# Patient Record
Sex: Female | Born: 1965 | Race: White | Hispanic: No | Smoking: Never smoker
Health system: Southern US, Community
[De-identification: ages and names within clinical notes are randomized; demographics above are authoritative.]

## PROBLEM LIST (undated history)

## (undated) DIAGNOSIS — F988 Other specified behavioral and emotional disorders with onset usually occurring in childhood and adolescence: Secondary | ICD-10-CM

## (undated) DIAGNOSIS — R42 Dizziness and giddiness: Secondary | ICD-10-CM

## (undated) HISTORY — PX: TONSILLECTOMY: SUR1361

## (undated) HISTORY — PX: BREAST SURGERY: SHX581

---

## 1998-01-01 ENCOUNTER — Other Ambulatory Visit: Admission: RE | Admit: 1998-01-01 | Discharge: 1998-01-01 | Payer: Self-pay | Admitting: Obstetrics and Gynecology

## 1999-01-05 ENCOUNTER — Other Ambulatory Visit: Admission: RE | Admit: 1999-01-05 | Discharge: 1999-01-05 | Payer: Self-pay | Admitting: Obstetrics and Gynecology

## 2000-01-05 ENCOUNTER — Other Ambulatory Visit: Admission: RE | Admit: 2000-01-05 | Discharge: 2000-01-05 | Payer: Self-pay | Admitting: Obstetrics and Gynecology

## 2001-01-08 ENCOUNTER — Other Ambulatory Visit: Admission: RE | Admit: 2001-01-08 | Discharge: 2001-01-08 | Payer: Self-pay | Admitting: Obstetrics and Gynecology

## 2002-01-16 ENCOUNTER — Encounter: Payer: Self-pay | Admitting: Obstetrics and Gynecology

## 2002-01-16 ENCOUNTER — Ambulatory Visit (HOSPITAL_COMMUNITY): Admission: RE | Admit: 2002-01-16 | Discharge: 2002-01-16 | Payer: Self-pay | Admitting: Obstetrics and Gynecology

## 2002-02-18 ENCOUNTER — Other Ambulatory Visit: Admission: RE | Admit: 2002-02-18 | Discharge: 2002-02-18 | Payer: Self-pay | Admitting: Obstetrics and Gynecology

## 2002-08-16 ENCOUNTER — Ambulatory Visit (HOSPITAL_COMMUNITY): Admission: RE | Admit: 2002-08-16 | Discharge: 2002-08-16 | Payer: Self-pay | Admitting: Neurology

## 2002-08-16 ENCOUNTER — Encounter: Payer: Self-pay | Admitting: Neurology

## 2003-04-01 ENCOUNTER — Other Ambulatory Visit: Admission: RE | Admit: 2003-04-01 | Discharge: 2003-04-01 | Payer: Self-pay | Admitting: Obstetrics and Gynecology

## 2003-05-16 ENCOUNTER — Ambulatory Visit (HOSPITAL_COMMUNITY): Admission: RE | Admit: 2003-05-16 | Discharge: 2003-05-16 | Payer: Self-pay | Admitting: Obstetrics and Gynecology

## 2003-11-03 ENCOUNTER — Ambulatory Visit: Admission: RE | Admit: 2003-11-03 | Discharge: 2003-11-03 | Payer: Self-pay | Admitting: *Deleted

## 2003-11-04 ENCOUNTER — Inpatient Hospital Stay (HOSPITAL_COMMUNITY): Admission: AD | Admit: 2003-11-04 | Discharge: 2003-11-08 | Payer: Self-pay | Admitting: Neurology

## 2004-06-10 ENCOUNTER — Other Ambulatory Visit: Admission: RE | Admit: 2004-06-10 | Discharge: 2004-06-10 | Payer: Self-pay | Admitting: Obstetrics and Gynecology

## 2010-02-03 ENCOUNTER — Ambulatory Visit (HOSPITAL_COMMUNITY): Admission: RE | Admit: 2010-02-03 | Discharge: 2010-02-03 | Payer: Self-pay | Admitting: Orthopedic Surgery

## 2010-07-26 ENCOUNTER — Encounter (INDEPENDENT_AMBULATORY_CARE_PROVIDER_SITE_OTHER): Payer: Self-pay | Admitting: *Deleted

## 2010-09-06 ENCOUNTER — Ambulatory Visit: Payer: Self-pay | Admitting: Internal Medicine

## 2010-09-06 DIAGNOSIS — K5900 Constipation, unspecified: Secondary | ICD-10-CM | POA: Insufficient documentation

## 2010-10-13 ENCOUNTER — Ambulatory Visit: Payer: Self-pay | Admitting: Internal Medicine

## 2010-10-15 ENCOUNTER — Encounter: Payer: Self-pay | Admitting: Internal Medicine

## 2010-11-16 NOTE — Letter (Signed)
Summary: New Patient letter  [facility]  [address] [place], [place] [address]   Phone: [phone]  Fax: [phone]       07/26/2010 MRN: [id number]  Victoria [name] [address] [place], [place]  [address]  Dear Ms. Victoria [name],  Welcome to the Gastroenterology Division at [facility].    You are scheduled to see Dr. Marina Goodell on 09/06/2010 at 11:00AM on the 3rd floor at [facility], [address]  We ask that you try to arrive at our office 15 minutes prior to your appointment time to allow for check-in.  We would like you to complete the enclosed self-administered evaluation form prior to your visit and bring it with you on the day of your appointment.  We will review it with you.  Also, please bring a complete list of all your medications or, if you prefer, bring the medication bottles and we will list them.  Please bring your insurance card so that we may make a copy of it.  If your insurance requires a referral to see a specialist, please bring your referral form from your primary care physician.  Co-payments are due at the time of your visit and may be paid by cash, check or credit card.     Your office visit will consist of a consult with your physician (includes a physical exam), any laboratory testing he/she may order, scheduling of any necessary diagnostic testing (e.g. x-ray, ultrasound, CT-scan), and scheduling of a procedure (e.g. Endoscopy, Colonoscopy) if required.  Please allow enough time on your schedule to allow for any/all of these possibilities.    If you cannot keep your appointment, please call [phone] to cancel or reschedule prior to your appointment date.  This allows [place] the opportunity to schedule an appointment for another patient in need of care.  If you do not cancel or reschedule by 5 p.m. the business day prior to your appointment date, you will be charged a $50.00 late cancellation/no-show fee.    Thank you for choosing [facility] for your medical needs.  We appreciate the opportunity to care for you.  Please visit [place] at our website  to learn more about our practice.                     Sincerely,                                                             The Gastroenterology Division

## 2010-11-16 NOTE — Letter (Signed)
Summary: [facility] Instructions  [facility]  [address] [place], [place] [address]   Phone: [phone]  Fax: [phone]       Victoria [name]    [birth year]    MRN: [id number]        Procedure Day /Date:WEDNESDAY 10/13/10     Arrival Time:1:30 PM     Procedure Time:2:30 PM     Location of Procedure:                    X  [facility] (4th Floor)     PREPARATION FOR COLONOSCOPY WITH MOVIPREP   Starting 5 days prior to your procedure 10/08/10 do not eat nuts, seeds, popcorn, corn, beans, peas,  salads, or any raw vegetables.  Do not take any fiber supplements (e.g. Metamucil, Citrucel, and Benefiber).  THE DAY BEFORE YOUR PROCEDURE         DATE: 10/12/10 DAY: TUESDAY / 1.  Drink clear liquids the entire day-NO SOLID FOOD  2.  Do not drink anything colored red or purple.  Avoid juices with pulp.  No orange juice.  3.  Drink at least 64 oz. (8 glasses) of fluid/clear liquids during the day to prevent dehydration and help the prep work efficiently.  CLEAR LIQUIDS INCLUDE: Water Jello Ice Popsicles Tea (sugar ok, no milk/cream) Powdered fruit flavored drinks Coffee (sugar ok, no milk/cream) Gatorade Juice: apple, white grape, white cranberry  Lemonade Clear bullion, consomm, broth Carbonated beverages (any kind) Strained chicken noodle soup Hard Candy                             4.  In the morning, mix first dose of MoviPrep solution:    Empty 1 Pouch A and 1 Pouch B into the disposable container    Add lukewarm drinking water to the top line of the container. Mix to dissolve    Refrigerate (mixed solution should be used within 24 hrs)  5.  Begin drinking the prep at 5:00 p.m. The MoviPrep container is divided by 4 marks.   Every 15 minutes drink the solution down to the next mark (approximately 8 oz) until the full liter is complete.   6.  Follow completed prep with 16 oz of clear liquid of your choice (Nothing red or purple).   Continue to drink clear liquids until bedtime.  7.  Before going to bed, mix second dose of MoviPrep solution:    Empty 1 Pouch A and 1 Pouch B into the disposable container    Add lukewarm drinking water to the top line of the container. Mix to dissolve    Refrigerate  THE DAY OF YOUR PROCEDURE      DATE: 10/13/10/DAY: WEDNESDAY  Beginning at 9:30 a.m. (5 hours before procedure):         1. Every 15 minutes, drink the solution down to the next mark (approx 8 oz) until the full liter is complete.  2. Follow completed prep with 16 oz. of clear liquid of your choice.    3. You may drink clear liquids until 7:30 AM (2 HOURS BEFORE PROCEDURE).   MEDICATION INSTRUCTIONS  Unless otherwise instructed, you should take regular prescription medications with a small sip of water   as early as possible the morning of your procedure.         OTHER INSTRUCTIONS  You will need a responsible adult at least 46 years of age  to accompany you and drive you home.   This person must remain in the waiting room during your procedure.  Wear loose fitting clothing that is easily removed.  Leave jewelry and other valuables at home.  However, you may wish to bring a book to read or  an iPod/MP3 player to listen to music as you wait for your procedure to start.  Remove all body piercing jewelry and leave at home.  Total time from sign-in until discharge is approximately 2-3 hours.  You should go home directly after your procedure and rest.  You can resume normal activities the  day after your procedure.  The day of your procedure you should not:   Drive   Make legal decisions   Operate machinery   Drink alcohol   Return to work  You will receive specific instructions about eating, activities and medications before you leave.    The above instructions have been reviewed and explained to me by   _______________________    I fully understand and can verbalize these instructions  _____________________________ Date _________

## 2010-11-16 NOTE — Assessment & Plan Note (Signed)
Summary: FAMILY HISTORY COLON CANCER, constipation   History of Present Illness Visit Type: Initial Consult Primary GI MD: Yancey Flemings MD Primary Provider: Candice Camp, MD Requesting Provider: Fawn Kirk, MD Chief Complaint: constipation, family hx of colon cancer History of Present Illness:   45 year old female with a history of depression, and cervical spine disease. She presents today regarding chronic problems with constipation and family history of colon cancer. He reports significant constipation over the past 6 months, at least. No new medications. She takes Correctol, Ducolax, and one scoop MiraLax. These agents were, but cause diarrhea. No bleeding or weight loss. Uncle (maternal) died of colon cancer at age 26. Mother and siblings without polyps.   GI Review of Systems    Reports weight gain.      Denies abdominal pain, acid reflux, belching, bloating, chest pain, dysphagia with liquids, dysphagia with solids, heartburn, loss of appetite, nausea, vomiting, vomiting blood, and  weight loss.      Reports change in bowel habits, constipation, and  diarrhea.     Denies anal fissure, black tarry stools, diverticulosis, fecal incontinence, heme positive stool, hemorrhoids, irritable bowel syndrome, jaundice, light color stool, liver problems, rectal bleeding, and  rectal pain. Preventive Screening-Counseling & Management  Alcohol-Tobacco     Smoking Status: current      Drug Use:  no.      Current Medications (verified): 1)  Symbyax 6-50 Mg Caps (Olanzapine-Fluoxetine Hcl) .... Take 1 Capsule By Mouth Every Evening 2)  Femhrt 1/5 1-5 Mg-Mcg Tabs (Norethindrone-Eth Estradiol) .... Once Daily As Directed 3)  Adderall 30 Mg Tabs (Amphetamine-Dextroamphetamine) .... Take 1 Tablet By Mouth Two Times A Day 4)  Multivitamins  Tabs (Multiple Vitamin) .... Once Daily 5)  Lamictal 100 Mg Tabs (Lamotrigine) .... Take 1 Tablet By Mouth Once Daily  Allergies (verified): 1)  ! 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  Alcohol Use - no Daily Caffeine Use Illicit Drug Use - no Smoking Status:  current Drug Use:  no  Review of Systems       The patient complains of allergy/sinus, arthritis/joint pain, back pain, and depression-new.  The patient denies anemia, anxiety-new, blood in urine, breast changes/lumps, change in vision, confusion, cough, coughing up blood, fainting, fatigue, fever, headaches-new, hearing problems, heart murmur, heart rhythm changes, itching, menstrual pain, muscle pains/cramps, night sweats, nosebleeds, pregnancy symptoms, shortness of breath, skin rash, sleeping problems, sore throat, swelling of feet/legs, swollen lymph glands, thirst - excessive , urination - excessive , urination changes/pain, urine leakage, vision changes, and voice change.    Vital Signs:  Patient profile:   45 year old female Height:      67 inches Weight:      153.50 pounds BMI:     24.13 Pulse rate:   76 / minute Pulse rhythm:   regular BP sitting:   92 / 62  (left arm) Cuff size:   regular  Vitals Entered By: June McMurray CMA Duncan Dull) (September 06, 2010 10:07 AM)  Physical Exam  General:  Well developed, well nourished, no acute distress. Head:  Normocephalic and atraumatic. Eyes:  PERRLA, no icterus. Ears:  Normal auditory acuity. Nose:  No deformity, discharge,  or lesions. Mouth:  No deformity or lesions,  dentition normal. Neck:  scar in the anterior neck. Otherwise normal Lungs:  Clear throughout to auscultation. Heart:  Regular rate and rhythm; no murmurs, rubs,  or bruits. Abdomen:  Soft, nontender and nondistended. No masses,  hepatosplenomegaly or hernias noted. Normal bowel sounds. Rectal:  deferred until colonoscopy Msk:  Symmetrical with no gross deformities. Normal posture. Pulses:  Normal pulses noted. Extremities:  No clubbing, cyanosis, edema or deformities noted. Neurologic:  Alert and  oriented x4;  grossly normal neurologically. Skin:  Intact without significant lesions or rashes.tattoo on the right ankle Psych:  Alert and cooperative. Normal mood and affect.   Impression & Recommendations:  Problem # 1:  FM HX MALIGNANT NEOPLASM GASTROINTESTINAL TRACT (ICD-V16.0) uncle with colon cancer at age 51.  Plan: #1. Screening colonoscopy. The nature of the procedure as well as the risks, benefits, and alternatives have been reviewed. She understood and agreed to proceed. Movi prep prescribed. The patient instructed on its use  Problem # 2:  SCREENING, COLON CANCER (ICD-V76.51) higher than baseline risk. See above  Problem # 3:  CONSTIPATION (ICD-564.00) chronic constipation. Likely functional.  Plan:  #1. Instructed to titrate MiraLax to achieve desired result  Other Orders: Colonoscopy (Colon)  Patient Instructions: 1)  Colonoscopy [facility] 10/13/10 2:30 pm 2)  Movi prep instructions given  3)  Movi prep Rx. sent to pharmacy 4)  Copy sent to : Candice Camp M.D. 5)  Colonoscopy and Flexible Sigmoidoscopy brochure given.  6)  The medication list was reviewed and reconciled.  All changed / newly prescribed medications were explained.  A complete medication list was provided to the patient / caregiver. Prescriptions: MOVIPREP 100 GM  SOLR (PEG-KCL-NACL-NASULF-NA ASC-C) As per prep instructions.  #1 x 0   Entered by:   Milford Cage NCMA   Authorized by:   Hilarie Fredrickson MD   Signed by:   Milford Cage NCMA on 09/06/2010   Method used:   Electronically to        [facility]. [phone]* (retail)       [address]       [place], [place]  [address]       Ph:  [phone] or [phone]       Fax: [phone]   RxID:   [id number]

## 2010-11-18 NOTE — Letter (Signed)
Summary: Patient Notice- Polyp Results  [facility]  [address] [place], [place] [address]   Phone: [phone]  Fax: [phone]        October 15, 2010 MRN: [id number]    TANISHI NAULT [address] [place], [place]  [address]    Dear Ms. Furlough,  I am pleased to inform you that the colon polyp removed during your recent colonoscopy was found to be benign (no cancer detected) upon pathologic examination. It was the hyperplastic (non-precancerous) type.  I recommend you have a repeat colonoscopy examination in 10 years to look for recurrent polyps, as having colon polyps increases your risk for having recurrent polyps or even colon cancer in the future.  Should you develop new or worsening symptoms of abdominal pain, bowel habit changes or bleeding from the rectum or bowels, please schedule an evaluation with either your primary care physician or with me.    Additional information/recommendations:  __ No further action with gastroenterology is needed at this time. Please      follow-up with your primary care physician for your other healthcare      needs.    Please call us if you are having persistent problems or have questions about your condition that have not been fully answered at this time.   Sincerely,  Wilhemina Bonito Perry,Jr. MD [facility] Division of Gastroenterology  This letter has been electronically signed by your physician.  Appended Document: Patient Notice- Polyp Results Letter mailed

## 2010-11-18 NOTE — Procedures (Signed)
Summary: Colonoscopy  [name] Note: All result statuses are Final unless otherwise noted.  Tests: (1) Colonoscopy (COL)   COL Colonoscopy           DONE     [facility]     [address]     [place], [place]  [address]           COLONOSCOPY PROCEDURE REPORT           PATIENT:  Victoria, [name]  MR#:  [id number]     BIRTHDATE:  [birth year], 44 yrs. old  GENDER:  female     ENDOSCOPIST:  Wilhemina Bonito. Eda Keys, MD     REF. BY:  Office     PROCEDURE DATE:  10/13/2010     PROCEDURE:  Colonoscopy with snare polypectomy x 1     ASA CLASS:  Class I     INDICATIONS:  family history of colon cancer, screening,     constipation ; Maternal uncle died of CRC at 70     MEDICATIONS:   Fentanyl 100 mcg IV, Versed 13 mg IV, Benadryl 50     mg IV           DESCRIPTION OF PROCEDURE:   After the risks benefits and     alternatives of the procedure were thoroughly explained, informed     consent was obtained.  Digital rectal exam was performed and     revealed no abnormalities.   The LB 180AL [id number] endoscope was     introduced through the anus and advanced to the cecum, which was     identified by both the appendix and ileocecal valve, without     limitations.Time to cecum = 3:30 min.  The quality of the prep     (Moviprep completed followed by preprocedure enemas) was fair but     up-graded to good with vigorous irrigation and suctioning.  The     instrument was then slowly withdrawn (16:55 min) as the colon was     fully examined.     <<PROCEDUREIMAGES>>           FINDINGS:  A diminutive polyp was found in the sigmoid colon.     Polyp was snared without cautery. Retrieval was successful.     Otherwise normal colonoscopy without other polyps, masses, vascular     ectasias, or inflammatory changes.   Retroflexed views in the     rectum revealed no abnormalities.    The scope was then withdrawn     from the patient and the procedure completed.           COMPLICATIONS:  None  ENDOSCOPIC IMPRESSION:     1) Diminutive polyp in the sigmoid colon - removed     2) Otherwise normal colonoscopy     3) Constipation           RECOMMENDATIONS:     1) Repeat colonoscopy in 5 years if polyp adenomatous; otherwise     10 years           ______________________________     Wilhemina Bonito. Eda Keys, MD           CC:  Victoria Camp, MD; The Patient           n.     eSIGNED:   Wilhemina Bonito. Eda Keys at 10/13/2010 03:23 PM           Victoria, [name], [id number]  Note: An exclamation mark (!) indicates  a result that was not dispersed into the flowsheet. Document Creation Date: 10/13/2010 3:23 PM _______________________________________________________________________  (1) Order result status: Final Collection or observation date-time: 10/13/2010 15:15 Requested date-time:  Receipt date-time:  Reported date-time:  Referring Physician:   Ordering Physician: Fransico Setters [phone]) Specimen Source:  Source: Launa Grill Order Number: [id number] Lab site:   Appended Document: Colonoscopy     Procedures Next Due Date:    Colonoscopy: 10/2020

## 2011-03-04 NOTE — H&P (Signed)
NAME:  Victoria [name], Victoria [name]                             ACCOUNT NO.:  [id number]   MEDICAL RECORD NO.:  [id number]                   PATIENT TYPE:   LOCATION:                                       FACILITY:   PHYSICIAN:  Dineen Kid. Rana Snare, M.D.                 DATE OF BIRTH:  23-Jun-1966   DATE OF ADMISSION:  DATE OF DISCHARGE:                                HISTORY & PHYSICAL   HISTORY OF PRESENT ILLNESS:  Victoria [name] is a 45 year old nulligravida white  female with strong family history of endometriosis and worsening discomfort  right before her cycles.  She also has dysmenorrhea.  She is currently  trying to conceive so does not desire to use oral contraceptive agents.  She  presents for a laparoscopic evaluation of the pain and evaluation for  possible endometriosis.   PAST MEDICAL HISTORY:  Her past medical history is negative.   PAST SURGICAL HISTORY:  Significant only for a tonsillectomy.   MEDICATIONS:  Currently she is on multivitamins.   ALLERGIES:  She is ALLERGIC TO SULFA.   PHYSICAL EXAMINATION:  VITAL SIGNS:  Blood pressure is 98/60, weight 125.  HEART:  Regular rate and rhythm.  LUNGS:  Clear to auscultation bilaterally.  ABDOMEN:  Nondistended, nontender, with a question of a right muscle injury  to the right of the midline along the right rectus muscle, which is mildly  tender to deep palpation.  PELVIC:  Normal external genitalia, Bartholins, Skene, and urethra.  The  uterus is retroverted, mobile, minimally tender to deep palpation.  No  adnexal masses are palpable.  No uterosacral nodularity is noted.   IMPRESSION AND PLAN:  Dysmenorrhea and pelvic pain not responsive to  conservative medical management.  She takes anti-inflammatory medications as  needed.  She does not appear to be a candidate for oral contraceptive agents  at this time, and has a strong family history of endometriosis.  We plan  laparoscopy for evaluation of the pelvic pain, possible lysis of  adhesions  or ablation of endometriosis implants.  Also plan chromopertubation at the  time of the surgery.  The risks and benefits were discussed at length, which  include, but are not limited to:  risk of infection, bleeding, damage to  uterus, tubes and ovaries, bowel, bladder, risks associated with anesthesia,  and also blood transfusion, possibility that we might not be able to  alleviate the pelvic pain or recurrence of the pelvic pain.  She then gave  her informed consent.                                               Dineen Kid Rana Snare, M.D.    DCL/MEDQ  D:  05/15/2003  T:  05/15/2003  Job:  [id number]

## 2011-03-04 NOTE — Discharge Summary (Signed)
NAME:  Victoria [name], Victoria [name]                           ACCOUNT NO.:  [id number]   MEDICAL RECORD NO.:  [id number]                   PATIENT TYPE:  INP   LOCATION:  3038                                 FACILITY:  [facility]   PHYSICIAN:  Genene Churn. Love, M.D.                 DATE OF BIRTH:  1966/07/18   DATE OF ADMISSION:  11/04/2003  DATE OF DISCHARGE:  11/08/2003                                 DISCHARGE SUMMARY   CHIEF COMPLAINT:  This is the first [facility] admission for this  45 year old right-handed white married female from [place], [place], admitted  from the office for evaluation of meningismus and headache.   HISTORY OF PRESENT ILLNESS:  Mrs. Bonk has no known prior history of  significant headaches, has been in good health for her entire life.  She has  transient vertigo in the past thought to be secondary to benign paroxysmal  positional vertigo.  One month prior to admission she was exposed to a step-  child who had two days of diarrhea.  The Sunday prior to admission the  patient developed diarrhea, and at 3 a.m. on November 02, 2003, awoke with  severe periorbital pain aggravated by turning her eyes to the left or to the  right which was treated with Advil without relief and then Tylenol.  She  stated that this was the worse headache she ever had, and the evening prior  to admission she underwent a CT scan without contrast enhancement at [facility] which showed no significant abnormalities.  On the day of  admission, she developed a temperature to 99.3 degrees, continued to have  headache, and was noted by her husband to have meningismus.  She was brought  to the office from which she was admitted.  There was no associated nausea,  vomiting, rash, high-grade fever, or tick exposure.   PAST MEDICAL HISTORY:  Significant for BBBB.   MEDICATIONS:  1. Serevent 20 mg daily.  2. Multivitamin daily.   PHYSICAL EXAMINATION:  Temperature of 98.4 and significant  meningismus.  Otherwise, her examination was unremarkable.   LABORATORY DATA:  MRI study of the brain with and without contrast  enhancement showed evidence of enhancement of the meninges without any  evidence of abnormalities on the diffusion weighted images.  MRA of the  brain was unremarkable.  A 12-lead EKG showed normal sinus rhythm.  A lumbar  puncture was performed the night of admission following the use of Betadine  and Xylocaine.  This revealed an opening pressure of 155 with cloudy CSF.  The CSF showed 90% white blood cell count, 92% lymphs, 1 red blood cell.  Protein was 180, glucose was 50.  Streptococcal antigens were unremarkable.  A serum infectious mono screen was negative.  Studies include CMV, herpes  simplex, and EB nuclear antigen, EBV pending.  Herpes simplex virus by  __________ CMV, and EBV  titers pending.   CBC and comprehensive metabolic panel were unremarkable.  A repeat basic  metabolic panel while on IV fluids was unremarkable.  Her white blood cell  count was 4200 at time of admission.  Liver function tests were normal.   HOSPITAL COURSE:  The patient was admitted, treated with Demerol and  Phenergan without benefit, switched to IV morphine pump for continued  headache.  Headache continued until the day of discharge, but was still  present when she stood up, raising the question of post LP headache.  She  was seen in consultation by Dr. Burnice Logan who felt that she most likely  had a form of aseptic meningitis.  Pending studies at the time of discharge  include herpes simplex virus by __________, CMV, EIA, EBV antigen.  In the  hospital, the patient received Toradol on two occasions and had a rash,  raising a question of allergy to Toradol.   IMPRESSION:  1. Aseptic meningitis, code 047.9.  2. Possible allergy to Toradol, code 995.2.   PLAN:  Discharge the patient on no driving for one week, Seraphim 20 mg  daily, laxative of choice, Dilaudid 2 mg  q.8h. p.r.n. pain.   CONDITION ON DISCHARGE:  She is discharged in improved normal pre-hospital  status.                                                Genene Churn. Sandria Manly, M.D.    JML/MEDQ  D:  11/08/2003  T:  11/08/2003  Job:  [id number]

## 2011-03-04 NOTE — Op Note (Signed)
NAME:  Victoria [name], Victoria [name]                           ACCOUNT NO.:  [id number]   MEDICAL RECORD NO.:  [id number]                   PATIENT TYPE:  INP   LOCATION:  3038                                 FACILITY:  [facility]   PHYSICIAN:  Genene Churn. Love, M.D.                 DATE OF BIRTH:  1966/02/15   DATE OF PROCEDURE:  11/04/2003  DATE OF DISCHARGE:                                 OPERATIVE REPORT   PROCEDURE PERFORMED:  Lumbar puncture.   INDICATIONS FOR PROCEDURE:  The patient has a history of headaches, stiff  neck, and is being evaluated for a possibility of meningitis.   DESCRIPTION OF PROCEDURE:  The patient was prepped and draped in left  lateral decubitus position using Betadine and 1% Xylocaine.  The L3-L4  interspace was entered without difficulty.  Opening pressure was 155 mmH2O.  It was pink to clear cerebrospinal fluid.  CSF was sent for studies.                                               Genene Churn. Sandria Manly, M.D.    JML/MEDQ  D:  11/04/2003  T:  11/05/2003  Job:  [id number]

## 2011-03-04 NOTE — Op Note (Signed)
NAME:  Victoria [name], Victoria [name]                           ACCOUNT NO.:  [id number]   MEDICAL RECORD NO.:  [id number]                   PATIENT TYPE:  AMB   LOCATION:  SDC                                  FACILITY:  [facility]   PHYSICIAN:  Dineen Kid. Rana Snare, M.D.                 DATE OF BIRTH:  1965/11/04   DATE OF PROCEDURE:  05/16/2003  DATE OF DISCHARGE:                                 OPERATIVE REPORT   PREOPERATIVE DIAGNOSES:  1. Pelvic pain.  2. Dysmenorrhea.   POSTOPERATIVE DIAGNOSES:  1. Pelvic pain.  2. Dysmenorrhea.  3. Mild endometriosis.   PROCEDURE:  Laparoscopy with ablation of endometriosis implants and  chromopertubation.   SURGEON:  Dineen Kid. Rana Snare, M.D.   ANESTHESIA:  General endotracheal.   INDICATIONS:  Ms. Batra is a 45 year old nulligravida white female with a  strong family history of endometriosis.  She also has worsening discomfort  before her menses and dysmenorrhea.  She presents for definitive evaluation  and treatment of pelvic pain.  The risks and benefits were discussed at  length, which include but are not limited to risk of infection, bleeding,  damage to the uterus, tubes, ovaries, bowel, or bladder, the possibility of  not being able to alleviate the pain or the cause of the pelvic pain, the  risks associated with anesthesia, and also blood loss with blood  transfusion.  Informed consent was obtained.   FINDINGS AT TIME OF SURGERY:  Mild pelvic endometriosis with implants in the  left ovarian fossa and on the left ovary, also left and right uterosacral  ligament.  Normal-appearing right ovary.  Both fallopian tubes were patent  with good spill noted bilaterally on chromopertubation.  The uterus was  within normal limits.  The bladder was also normal.  The appendix and liver  appeared to be normal as well.   DESCRIPTION OF PROCEDURE:  After adequate anesthesia, the patient was placed  in the dorsal lithotomy position.  She was sterilely prepped and draped,  the  bladder was sterilely drained, a Graves speculum was placed, and a Cohen  tenaculum was placed on the cervix.  A 1 cm infraumbilical skin incision was  made, a Veress needle was inserted, and the abdomen was insufflated to  dullness to percussion, and an 11 mm trocar was inserted.  The above  findings were noted.  A 5 mm trocar was inserted to the left of midline two  fingerbreadths above the pubic symphysis under direct visualization.  The  attempt to use the CO2 laser for ablation of the endometriosis was  unsuccessful due to the fact of difficulties with the laser machine.  Bipolar cautery was then used to cauterize the implants of the left ovarian  fossa and along the left ovary with good thermal burn noted.  Care taken to  avoid the underlying vessels and ureter, at the same time identifying all  obvious areas of endometriosis and ablating these.  This was done similarly  across the left and right uterosacral ligament implants and a right Masters  window similarly ablated with good thermal burn noted throughout the  peritoneum and no residual endometriosis noted.  After careful examination  throughout the pelvis and reassurance that there was no obvious  endometriosis remaining, chromopertubation was performed with good spill  bilaterally.  At this time the abdomen was desufflated and the trocar  removed.  The infraumbilical skin incision was closed with a 0 Vicryl and in  a figure-of-eight through the fascia and a 3-0 Vicryl Rapide subcuticular  stitch.  The 5 mm site was closed with a 3-0 Vicryl Rapide subcuticular  stitch.  The incisions were injected with 0.25% 10 mL and the Cohen  tenaculum was noted.  The cervix was noted to be hemostatic.  The patient  was transferred to the recovery room in stable condition.  Sponge and  instrument count was correct x3.  Estimated blood loss less than 10 mL.   DISPOSITION:  The patient will be discharged home and will follow up in the   office in two to three weeks.  She will be given a routine instruction sheet  for laparoscopy and a prescription for Darvocet #30.                                               Dineen Kid Rana Snare, M.D.    DCL/MEDQ  D:  05/16/2003  T:  05/16/2003  Job:  [id number]

## 2011-03-04 NOTE — H&P (Signed)
NAME:  Victoria [name], Victoria [name]                           ACCOUNT NO.:  [id number]   MEDICAL RECORD NO.:  [id number]                   PATIENT TYPE:  INP   LOCATION:  3038                                 FACILITY:  [facility]   PHYSICIAN:  Genene Churn. Love, M.D.                 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   The patient has no known prior history of headaches, but does have a  positive family history of migraine.  She had been in good health except for  a known history of benign paroxysmal positional vertigo.  One month ago, she  was exposed to a stepchild who had two days of diarrhea.  The patient did  well until this past Sunday, November 02, 2003, when she had diarrhea.  She  awoke about 3 a.m. with severe periorbital pain which was aggravated by  turning her eyes to the right and to the left.  This was treated with Advil  and subsequently with Tylenol, but the headache continued and has never gone  away since that time.  She spoke with a neighbor physician, Balinda Quails,  M.D., about the headache pain yesterday and told him that it was the worst  headache pain she had ever had.  A CT scan of the brain without contrast  enhancement was obtained at [facility] which was normal.  Today her  headache is continued and her husband documented a low grade fever of 99.3  degrees.  He also noted that she had a very stiff neck, was having pain in  cutting her eyes to the right or left.  There was no associated nausea,  vomiting, rash, or high fever and she denied any known history of tick  exposure. She does have an indoor cat and an indoor dog.  She has not  had  any head or neck trauma.   PAST MEDICAL HISTORY:  Significant for benign paroxysmal positional vertigo.   MEDICATIONS:  1. Sarafem 20 mg daily.  2. Multivitamins one p.o. daily.   ALLERGIES:  SULFA.   SOCIAL HISTORY:  Married.  She states that she is happy with life.  She had  three stepchildren and is married to [employer]. 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  She drinks two cups of coffee per day.  She  drinks one to three glasses of wine per day.  She finished high school.  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   FAMILY HISTORY:  Her mother is 85, her father is 47  and living well.  She  has an aunt who has had heart disease.  Mother and father have had high  cholesterol.  Her father has had diabetes.  Her mother has had migraine  headaches.  She has sisters 45 and 25, one of whom now also has high  cholesterol.   PHYSICAL EXAMINATION:  GENERAL:  Well-developed white female who appeared  ill.  VITAL SIGNS:  Blood pressure in the right arm was 100/60, left arm was  90/60.  Heart rate was 84 and regular.  Temperature 98.4 degrees.  She had a  stiff neck.  She had a negative Babinski sign.  She was alert and oriented  x3.  NEUROLOGY:  Cranial nerve examination revealed visual fields to be full.  Both discs were seen and flat.  The extraocular movements were full.  The  corneals were present.  There was no 7th nerve palsy.  Hearing was present  with air conduction greater than bone conduction.  Tongue was midline.  The  uvula was midline.  Gag was present.  Sternocleidomastoid and trapezius  testing were normal.  Her motor examination revealed 5/5 strength proximally  and distally in the upper and lower extremities without any evidence of  proximal pronator or distal drift.  Coordination testing was normal.  Sensory examination was intact to pinprick, light touch, general position,  and vibration testing.  Deep tendon  reflexes are 1 to 2+ and plantar  responses were downgoing.  HEENT:  Tympanic membranes clear.  Mouth was in good repair.  LUNGS:  Clear to auscultation.  HEART:  No murmurs.  ABDOMEN:  Bowel sounds were normal. There was no enlargement of the liver,  spleen, or kidneys.  EXTREMITIES:  There was no cyanosis, clubbing, or edema in the extremities.   IMPRESSION:  1. Headache.  784.0  2. 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   PLAN:  Admit the patient for MRI, intracranial MRA, and consideration of  spinal tap after the above.                                                Genene Churn. Sandria Manly, M.D.    JML/MEDQ  D:  11/04/2003  T:  11/05/2003  Job:  [id number]

## 2013-04-09 ENCOUNTER — Other Ambulatory Visit: Payer: Self-pay | Admitting: Obstetrics and Gynecology

## 2013-04-09 DIAGNOSIS — T8544XA Capsular contracture of breast implant, initial encounter: Secondary | ICD-10-CM

## 2013-04-09 DIAGNOSIS — N6451 Induration of breast: Secondary | ICD-10-CM

## 2013-04-18 ENCOUNTER — Ambulatory Visit
Admission: RE | Admit: 2013-04-18 | Discharge: 2013-04-18 | Disposition: A | Discharge status: Home / Self Care | Payer: Commercial Managed Care - PPO | Source: Ambulatory Visit | Attending: Obstetrics and Gynecology | Admitting: Obstetrics and Gynecology

## 2013-04-18 DIAGNOSIS — T8544XA Capsular contracture of breast implant, initial encounter: Secondary | ICD-10-CM

## 2013-04-18 DIAGNOSIS — N6451 Induration of breast: Secondary | ICD-10-CM

## 2017-12-18 ENCOUNTER — Other Ambulatory Visit: Payer: Self-pay | Admitting: Orthopedic Surgery

## 2017-12-28 ENCOUNTER — Ambulatory Visit (HOSPITAL_BASED_OUTPATIENT_CLINIC_OR_DEPARTMENT_OTHER)
Admission: RE | Admit: 2017-12-28 | Payer: Commercial Managed Care - PPO | Source: Ambulatory Visit | Admitting: Orthopedic Surgery

## 2017-12-28 ENCOUNTER — Encounter (HOSPITAL_BASED_OUTPATIENT_CLINIC_OR_DEPARTMENT_OTHER): Admission: RE | Payer: Self-pay | Source: Ambulatory Visit

## 2017-12-28 SURGERY — NERVE, TENDON AND ARTERY REPAIR
Anesthesia: Regional | Site: Finger | Laterality: Left

## 2020-12-29 ENCOUNTER — Encounter: Payer: Self-pay | Admitting: Internal Medicine

## 2021-12-27 ENCOUNTER — Emergency Department (HOSPITAL_BASED_OUTPATIENT_CLINIC_OR_DEPARTMENT_OTHER)
Admission: EM | Admit: 2021-12-27 | Discharge: 2021-12-27 | Disposition: A | Discharge status: Home / Self Care | Payer: PRIVATE HEALTH INSURANCE | Attending: Emergency Medicine | Admitting: Emergency Medicine

## 2021-12-27 ENCOUNTER — Emergency Department (HOSPITAL_BASED_OUTPATIENT_CLINIC_OR_DEPARTMENT_OTHER): Payer: PRIVATE HEALTH INSURANCE

## 2021-12-27 ENCOUNTER — Encounter (HOSPITAL_BASED_OUTPATIENT_CLINIC_OR_DEPARTMENT_OTHER): Payer: Self-pay | Admitting: Emergency Medicine

## 2021-12-27 ENCOUNTER — Other Ambulatory Visit: Payer: Self-pay

## 2021-12-27 DIAGNOSIS — Z20822 Contact with and (suspected) exposure to covid-19: Secondary | ICD-10-CM | POA: Diagnosis not present

## 2021-12-27 DIAGNOSIS — R42 Dizziness and giddiness: Secondary | ICD-10-CM | POA: Diagnosis not present

## 2021-12-27 DIAGNOSIS — R059 Cough, unspecified: Secondary | ICD-10-CM | POA: Insufficient documentation

## 2021-12-27 DIAGNOSIS — R000 Tachycardia, unspecified: Secondary | ICD-10-CM | POA: Insufficient documentation

## 2021-12-27 DIAGNOSIS — R911 Solitary pulmonary nodule: Secondary | ICD-10-CM | POA: Diagnosis not present

## 2021-12-27 DIAGNOSIS — R5383 Other fatigue: Secondary | ICD-10-CM | POA: Insufficient documentation

## 2021-12-27 HISTORY — DX: Dizziness and giddiness: R42

## 2021-12-27 HISTORY — DX: Other specified behavioral and emotional disorders with onset usually occurring in childhood and adolescence: F98.8

## 2021-12-27 LAB — TROPONIN I (HIGH SENSITIVITY): Troponin I (High Sensitivity): 2 ng/L (ref <18)

## 2021-12-27 LAB — BASIC METABOLIC PANEL
Anion gap: 8 (ref 5–15)
BUN: 15 mg/dL (ref 6–20)
CO2: 28 mmol/L (ref 22–32)
Calcium: 10 mg/dL (ref 8.9–10.3)
Chloride: 100 mmol/L (ref 98–111)
Creatinine, Ser: 0.89 mg/dL (ref 0.44–1.00)
GFR, Estimated: >60 mL/min (ref >60)
Glucose, Bld: 116 mg/dL — ABNORMAL HIGH (ref 70–99)
Potassium: 4 mmol/L (ref 3.5–5.1)
Sodium: 136 mmol/L (ref 135–145)

## 2021-12-27 LAB — CBC WITH DIFFERENTIAL/PLATELET
Abs Immature Granulocytes: 0.04 10*3/uL (ref 0.00–0.07)
Basophils Absolute: 0.1 10*3/uL (ref 0.0–0.1)
Basophils Relative: 1 %
Eosinophils Absolute: 0.1 10*3/uL (ref 0.0–0.5)
Eosinophils Relative: 1 %
HCT: 41.3 % (ref 36.0–46.0)
Hemoglobin: 13.8 g/dL (ref 12.0–15.0)
Immature Granulocytes: 1 %
Lymphocytes Relative: 23 %
Lymphs Abs: 1.6 10*3/uL (ref 0.7–4.0)
MCH: 30.5 pg (ref 26.0–34.0)
MCHC: 33.4 g/dL (ref 30.0–36.0)
MCV: 91.2 fL (ref 80.0–100.0)
Monocytes Absolute: 0.7 10*3/uL (ref 0.1–1.0)
Monocytes Relative: 9 %
Neutro Abs: 4.7 10*3/uL (ref 1.7–7.7)
Neutrophils Relative %: 65 %
Platelets: 332 10*3/uL (ref 150–400)
RBC: 4.53 MIL/uL (ref 3.87–5.11)
RDW: 12.4 % (ref 11.5–15.5)
WBC: 7.1 10*3/uL (ref 4.0–10.5)
nRBC: 0 % (ref 0.0–0.2)

## 2021-12-27 LAB — RESP PANEL BY RT-PCR (FLU A&B, COVID) ARPGX2
Influenza A by PCR: NEGATIVE
Influenza B by PCR: NEGATIVE
SARS Coronavirus 2 by RT PCR: NEGATIVE

## 2021-12-27 LAB — MAGNESIUM: Magnesium: 2.1 mg/dL (ref 1.7–2.4)

## 2021-12-27 MED ORDER — SODIUM CHLORIDE 0.9 % IV BOLUS
1000.0000 mL | Freq: Once | INTRAVENOUS | Status: AC
  Administered 2021-12-27: 1000 mL via INTRAVENOUS

## 2021-12-27 MED ORDER — MECLIZINE HCL 25 MG PO TABS
50.0000 mg | ORAL_TABLET | Freq: Once | ORAL | Status: AC
Start: 2021-12-27 — End: 2021-12-27
  Administered 2021-12-27: 50 mg via ORAL
  Filled 2021-12-27: qty 2

## 2021-12-27 MED ORDER — IOHEXOL 350 MG/ML SOLN
100.0000 mL | Freq: Once | INTRAVENOUS | Status: AC | PRN
  Administered 2021-12-27: 100 mL via INTRAVENOUS

## 2021-12-27 NOTE — ED Provider Notes (Signed)
[facility] EMERGENCY DEPARTMENT Provider Note   CSN: [id number] Arrival date & time: 12/27/21  [id number]     History  Chief Complaint  Patient presents with   Dizziness    Victoria [name] is a 56 y.o. female presented ED with acute onset of lightheadedness, vertigo.  Patient reports that she has been treated for "bronchitis" which she has had symptoms for about 2 weeks.  She was started on Augmentin 3 days ago and has been taking it fine.  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  She says she feels "like I am outside of my body".  She feels extremely fatigued, lightheaded.  She does have some vertigo as well but no significant symptoms of this.  She denies headache.  She denies chest pain or pressure.  She continues to have a cough from her bronchitis.  She had x-ray films performed per my review of external records approximately 1 week ago which did not show any focal infiltrate.  She denies history of smoking, emphysema, lung disease.  She denies history of DVT or PE.  She denies personal family history of arrhythmia or coronary disease.  HPI     Home Medications Prior to Admission medications   Medication Sig Start Date End Date Taking? Authorizing Provider  albuterol (VENTOLIN HFA) 108 (90 Base) MCG/ACT inhaler Inhale into the lungs. 12/21/21  Yes [provider]  amoxicillin-clavulanate (AUGMENTIN) 875-125 MG tablet Take 1 tablet by mouth 2 (two) times daily. 12/21/21  Yes [provider]  busPIRone (BUSPAR) 15 MG tablet Take 1 tablet by mouth 2 (two) times daily. 12/03/20  Yes [provider]  lamoTRIgine (LAMICTAL) 150 MG tablet Take 1 tablet by mouth 2 (two) times daily. 09/14/18  Yes [provider]  OLANZapine-FLUoxetine (SYMBYAX) 6-25 MG capsule TAKE 1 CAPSULE BY MOUTH DAILY AT BEDTIME 01/24/18  Yes [provider]  amphetamine-dextroamphetamine (ADDERALL XR) 20 MG 24 hr  capsule Take by mouth.    [provider]  amphetamine-dextroamphetamine (ADDERALL) 30 MG tablet Take 1 tablet by mouth 3 (three) times daily. 10/04/21   [provider]  Multiple Vitamin (MULTIVITAMIN) capsule Take 1 capsule by mouth daily.    [provider]      Allergies    Sulfonamide derivatives    Review of Systems   Review of Systems  Physical Exam Updated Vital Signs BP 107/60    Pulse 77    Temp 97.9 F (36.6 C) (Oral)    Resp 18    Ht 5\' 7"  (1.702 m)    Wt 74.8 kg    SpO2 95%    BMI 25.84 kg/m  Physical Exam Constitutional:      General: She is not in acute distress.    Comments: Appears fatigued, dizzy  HENT:     Head: Normocephalic and atraumatic.  Eyes:     Conjunctiva/sclera: Conjunctivae normal.     Pupils: Pupils are equal, round, and reactive to light.  Cardiovascular:     Rate and Rhythm: Regular rhythm. Tachycardia present.     Pulses: Normal pulses.  Pulmonary:     Effort: Pulmonary effort is normal. No respiratory distress.     Comments: 90-95% O2 on room air Abdominal:     General: There is no distension.     Tenderness: There is no abdominal tenderness.  Skin:    General: Skin is warm and dry.  Neurological:     General:  No focal deficit present.     Mental Status: She is alert and oriented to person, place, and time. Mental status is at baseline.     Cranial Nerves: No cranial nerve deficit.     Sensory: No sensory deficit.     Coordination: Coordination normal.     Gait: Gait normal.    ED Results / Procedures / Treatments   Labs (all labs ordered are listed, but only abnormal results are displayed) Labs Reviewed  BASIC METABOLIC PANEL - Abnormal; Notable for the following components:      Result Value   Glucose, Bld 116 (*)    All other components within normal limits  RESP PANEL BY RT-PCR (FLU A&B, COVID) ARPGX2  CBC WITH DIFFERENTIAL/PLATELET  MAGNESIUM  TROPONIN I (HIGH SENSITIVITY)    EKG EKG  Interpretation  Date/Time:  Monday December 27 2021 10:42:30 EDT Ventricular Rate:  88 PR Interval:  174 QRS Duration: 94 QT Interval:  403 QTC Calculation: 488 R Axis:   72 Text Interpretation: Sinus rhythm Borderline prolonged QT interval Confirmed by Octaviano Glow [phone]) on 12/27/2021 10:43:45 AM  Radiology CT Angio Chest PE W and/or Wo Contrast  Result Date: 12/27/2021 CLINICAL DATA:  Dizziness, cough, high probability of pulmonary embolism. EXAM: CT ANGIOGRAPHY CHEST WITH CONTRAST TECHNIQUE: Multidetector CT imaging of the chest was performed using the standard protocol during bolus administration of intravenous contrast. Multiplanar CT image reconstructions and MIPs were obtained to evaluate the vascular anatomy. RADIATION DOSE REDUCTION: This exam was performed according to the departmental dose-optimization program which includes automated exposure control, adjustment of the mA and/or kV according to patient size and/or use of iterative reconstruction technique. CONTRAST:  151mL OMNIPAQUE IOHEXOL 350 MG/ML SOLN COMPARISON:  None. FINDINGS: Cardiovascular: Negative for pulmonary embolus. Heart is enlarged. No pericardial effusion. Mediastinum/Nodes: No pathologically enlarged mediastinal, hilar or axillary lymph nodes. Esophagus is grossly unremarkable. Lungs/Pleura: Image quality is degraded by expiratory phase imaging and respiratory motion. 6 mm posterior segment right upper lobe nodule (5/32). 4 mm subpleural right middle lobe nodule (5/57), likely a benign subpleural lymph node. 2 mm lingular nodule (5/44). No pleural fluid. Airway is otherwise unremarkable. Upper Abdomen: Visualized portions of the liver, left adrenal gland, left kidney, spleen, pancreas, stomach and bowel are grossly unremarkable. Musculoskeletal: Bilateral breast prostheses, asymmetric in overall size. Internal curvilinear configuration, suggesting intracapsular rupture. Degenerative changes in the spine. No worrisome  lytic or sclerotic lesions. Review of the MIP images confirms the above findings. IMPRESSION: 1. Negative for pulmonary embolus. 2. Pulmonary nodules measure up to 6 mm in the right upper lobe. Non-contrast chest CT at 3-6 months is recommended. If the nodules are stable at time of repeat CT, then future CT at 18-24 months (from today's scan) is considered optional for low-risk patients, but is recommended for high-risk patients. This recommendation follows the consensus statement: Guidelines for Management of Incidental Pulmonary Nodules Detected on CT Images: From the Fleischner Society 2017; Radiology 2017; 284:228-243. 3. Bilateral breast prostheses, asymmetric in size. Internal curvilinear configuration bilaterally suggest intracapsular rupture. Electronically Signed   By: Lorin Picket M.D.   On: 12/27/2021 11:29    Procedures Procedures    Medications Ordered in ED Medications  sodium chloride 0.9 % bolus 1,000 mL (0 mLs Intravenous Stopped 12/27/21 1145)  meclizine (ANTIVERT) tablet 50 mg (50 mg Oral Given 12/27/21 1018)  iohexol (OMNIPAQUE) 350 MG/ML injection 100 mL (100 mLs Intravenous Contrast Given 12/27/21 1113)    ED Course/ Medical Decision Making/ A&P  Clinical Course as of 12/27/21 1657  Mon Dec 27, 2021  1356 Patient overall feels stable and somewhat better after the fluids.  The meclizine did not appear to help her lightheadedness.  She would prefer to go home.  We discussed observation options in the hospital including stay for an echocardiogram, given that this may be near syncope.  She would prefer to do this as an outpatient.  I think this is reasonable given her overall benign work-up.  I advised that she keep yourself well-hydrated at home with fluids.  Very close return precautions were discussed, and she is aware that we have not ruled out all serious potential cardiac disease.  I have a lower suspicion at this time however for meningitis, brain aneurysm, sepsis, or stroke.  [MT]  [id number] I did discuss with her pulmonary nodules and the need for pulmonology follow-up and she verbalized understanding. [MT]    Clinical Course User Index [MT] Mary-Ann Pennella, Carola Rhine, MD                           Medical Decision Making Amount and/or Complexity of Data Reviewed Labs: ordered. Radiology: ordered. ECG/medicine tests: ordered.  Risk Prescription drug management.   This patient presents to the ED with concern for lightheadedness, fatigue this involves an extensive number of treatment options, and is a complaint that carries with it a high risk of complications and morbidity.  The differential diagnosis includes anemia versus dehydration versus pulmonary embolism versus bacterial pneumonia versus other  External records from outside source obtained and reviewed including x-ray performed as an outpatient showed no focal infiltrate 1 week ago.  However with her productive cough, I still high clinical suspicion for possible PNA vs PE and discussed CT imaging.  I ordered and personally interpreted labs.  The pertinent results include:  no acute anemia, no AKI, electrolytes wnl, covid/flu negative, trop 2.  No leukocytosis  I ordered imaging studies including CT PE I independently visualized and interpreted imaging which showed pulmonary nodules I agree with the radiologist interpretation  The patient was maintained on a cardiac monitor.  I personally viewed and interpreted the cardiac monitored which showed an underlying rhythm of: NSR  Per my interpretation the patient's ECG shows NSR without acute ischemic findings  I ordered medication including IV fluids, meclizine    Test Considered: no headache or focal neurological symptoms to suggest CVA.  Low suspicion for [facility] without headache.  Doubt meningitis clinically, afebrile without headache or leukocytosis or nuccal rigidity.  I did not feel emergent LP or CT imaging of the brain was indicated at this time.  No  significant cardiac murmurs to suspect severe valvular disease. Doubt aortic dissection or vascular dissection with no neck or chest pain.  After the interventions noted above, I reevaluated the patient and found that they have: improved  Dispostion:  After consideration of the diagnostic results and the patients response to treatment, I feel that the patent would benefit from pulmonary nodule clinic follow up, cards referral for possible echocardiogram.  Close return precautions discussed with patient.         Final Clinical Impression(s) / ED Diagnoses Final diagnoses:  Lightheadedness  Pulmonary nodule    Rx / DC Orders ED Discharge Orders          Ordered    Ambulatory referral to Cardiology       Comments: Near syncope   12/27/21 1404    Ambulatory referral  to Pulmonology        12/27/21 1406              Wyvonnia Dusky, MD 12/27/21 1701

## 2021-12-27 NOTE — Discharge Instructions (Addendum)
Please drink plenty of water, keep yourself hydrated, and follow-up with your primary care clinic.  You should not drive a car if you feel dizzy or lightheaded. ? ?If your symptoms get worse, if you have sudden worsening headache, loss of vision, balance difficulty, slurred speech, or any strokelike symptoms, or any new or worsening chest pain, please come back to the ER immediately ? ?* ? ?Your CT scan of the lungs showed: ? ?1. Negative for pulmonary embolus. ?2. Pulmonary nodules measure up to 6 mm in the right upper lobe. ?Non-contrast chest CT at 3-6 months is recommended. If the nodules ?are stable at time of repeat CT, then future CT at 18-24 months ?(from today's scan) is considered optional for low-risk patients, ?but is recommended for high-risk patients. This recommendation ?follows the consensus statement: Guidelines for Management of ?Incidental Pulmonary Nodules Detected on CT Images: From the ?Fleischner Society 2017; Radiology 2017; [phone]. ?

## 2021-12-27 NOTE — ED Notes (Signed)
Ambulated pt to the bathroom, required standby assist due to dizziness. ?

## 2021-12-27 NOTE — ED Triage Notes (Signed)
Per EMS pt from work , spells of dizziness today , recent Dx of bronchitis. 3 days of antibiotics   ?

## 2023-05-27 IMAGING — CT CT ANGIO CHEST
2 of 8 series · 18 of 36 positions shown · IV contrast (agent unspecified)
Comparison: None.

CLINICAL DATA: Dizziness, cough, high probability of pulmonary
embolism.

EXAM:
CT ANGIOGRAPHY CHEST WITH CONTRAST
TECHNIQUE: Multidetector CT imaging of the chest was performed using the
standard protocol during bolus administration of intravenous
contrast. Multiplanar CT image reconstructions and MIPs were
obtained to evaluate the vascular anatomy.

[Series 6: pe coronal mpr · coronal · 0.53mm/px · 1 of 120 slices shown]
[im 60/120  mediastinal]
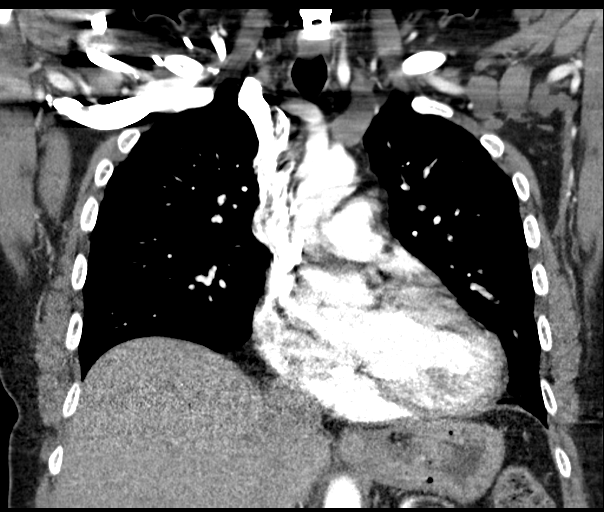

[Series 10: pe thins · axial · 0.72mm/px · z∈[-235,+2]mm · 17 of 265 slices shown]
[im 14/265  lung]
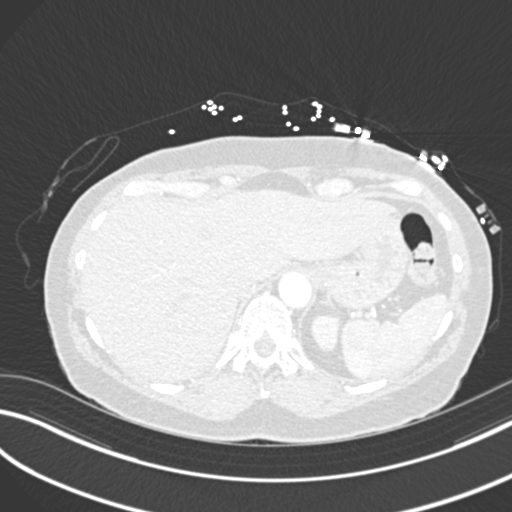
[im 28/265  mediastinal]
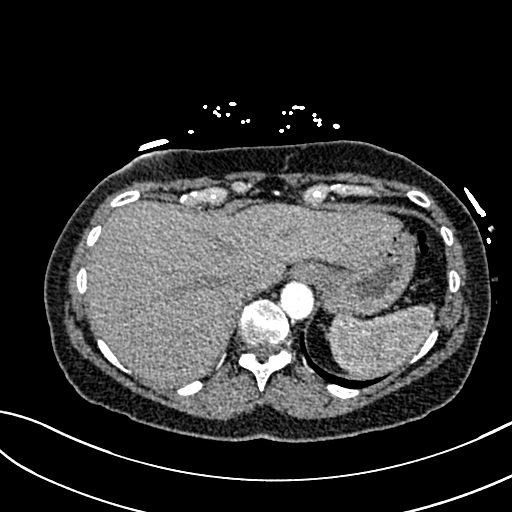
[im 42/265  lung]
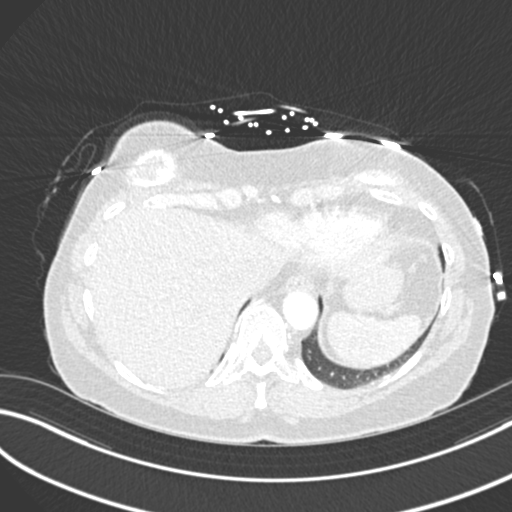
[im 56/265  mediastinal]
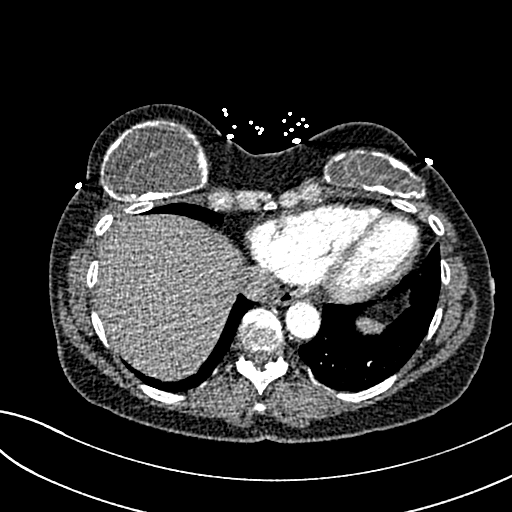
[im 70/265  lung]
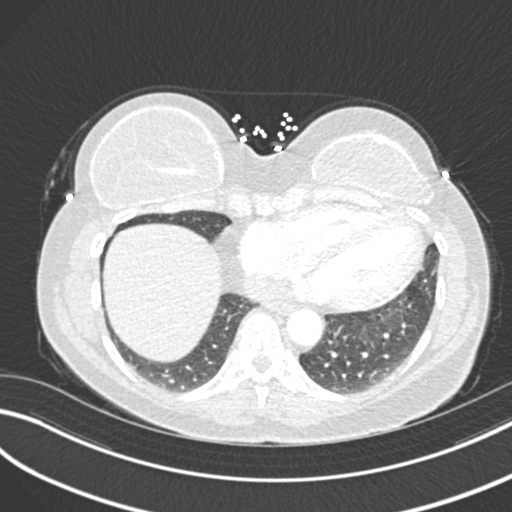
[im 84/265  mediastinal]
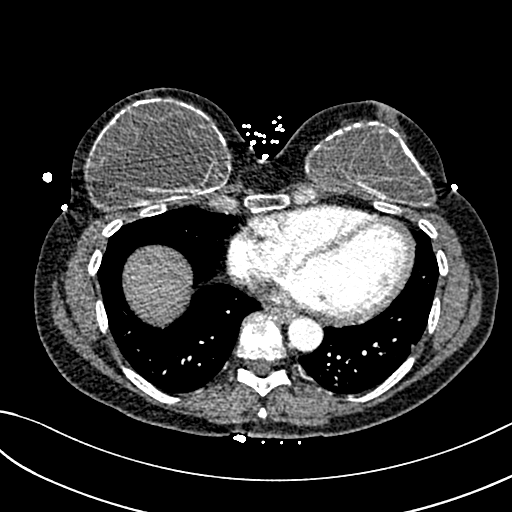
[im 98/265  lung]
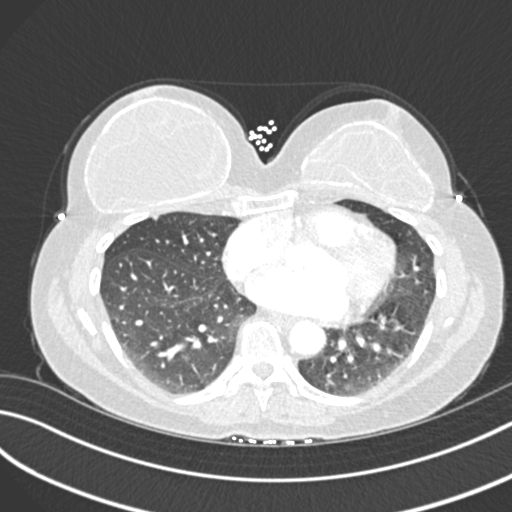
[im 112/265  mediastinal]
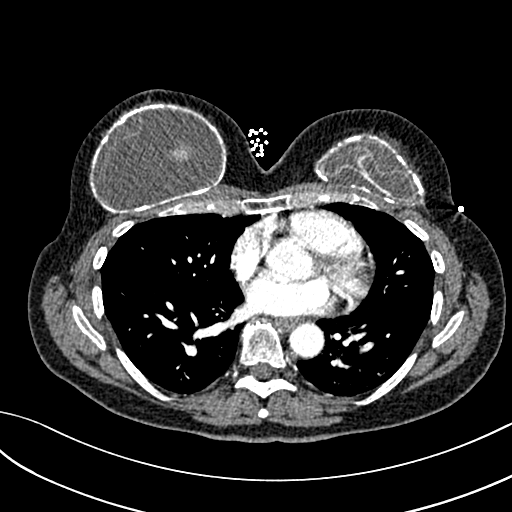
[im 139/265  lung]
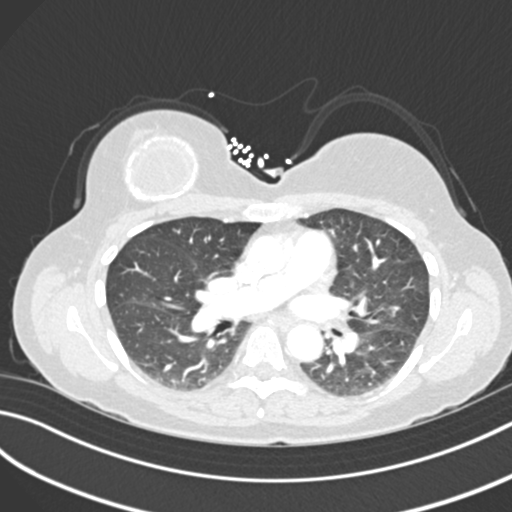
[im 153/265  mediastinal]
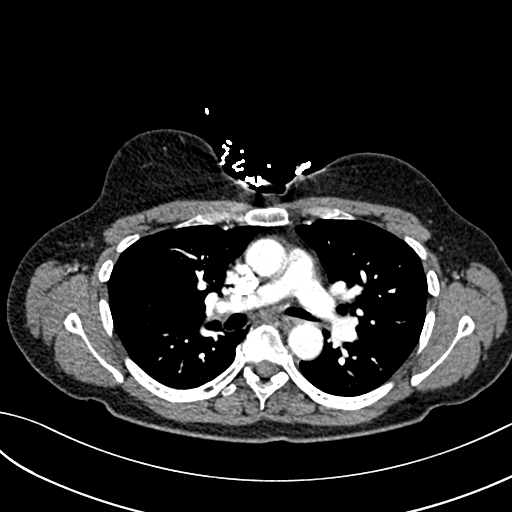
[im 167/265  lung]
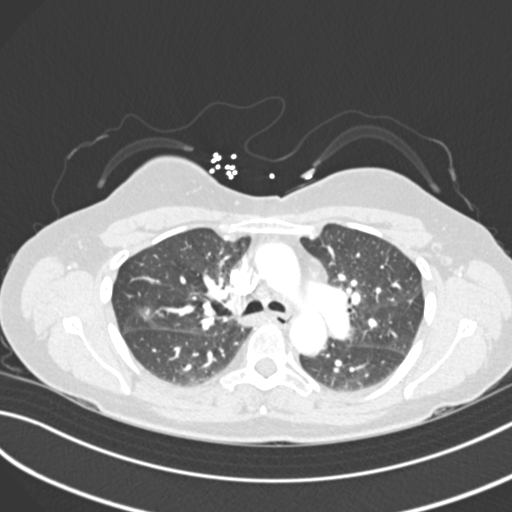
[im 181/265  mediastinal]
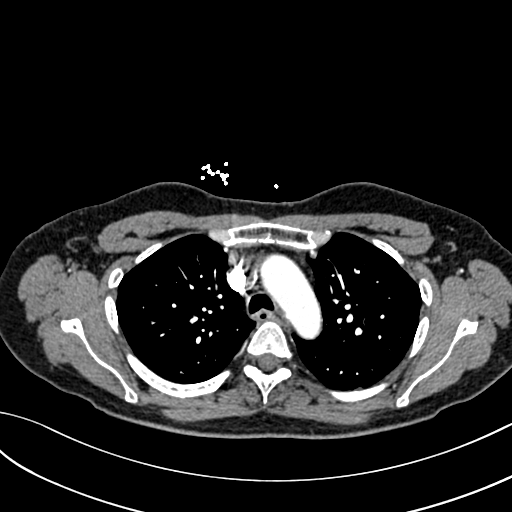
[im 195/265  lung]
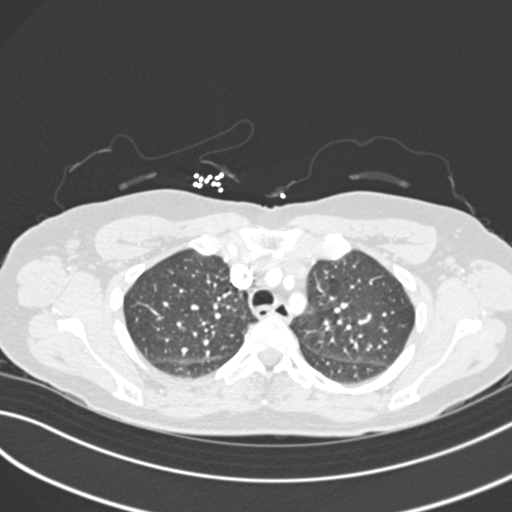
[im 209/265  mediastinal]
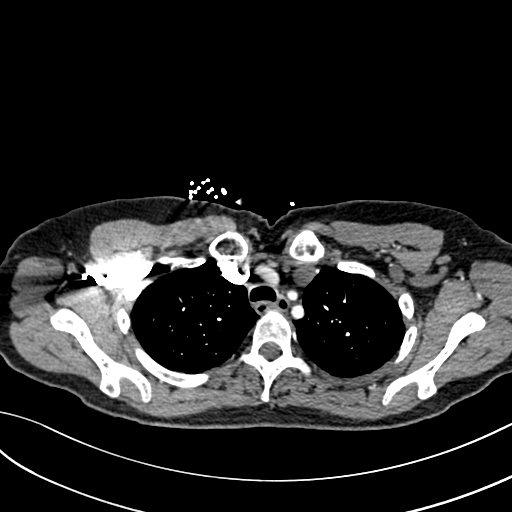
[im 223/265  lung]
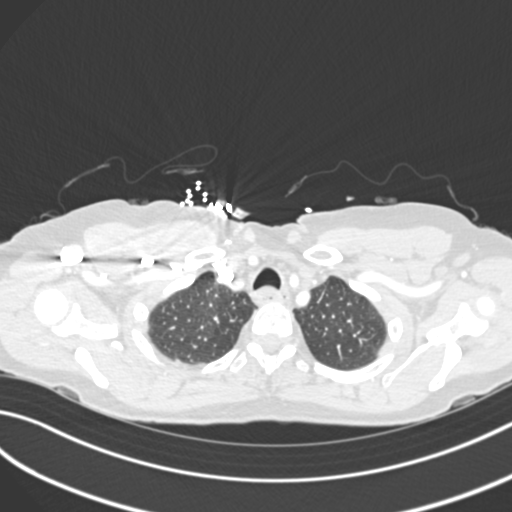
[im 237/265  mediastinal]
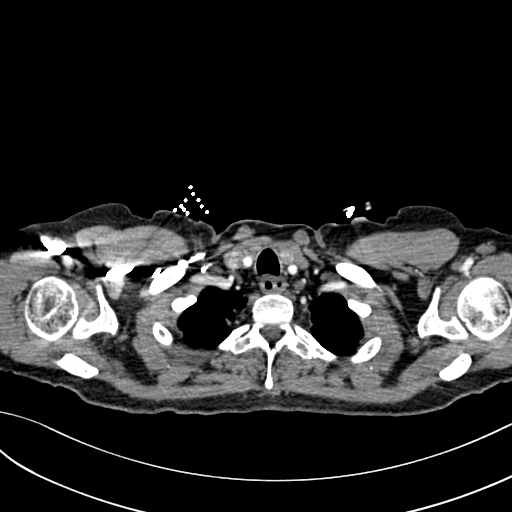
[im 251/265  lung]
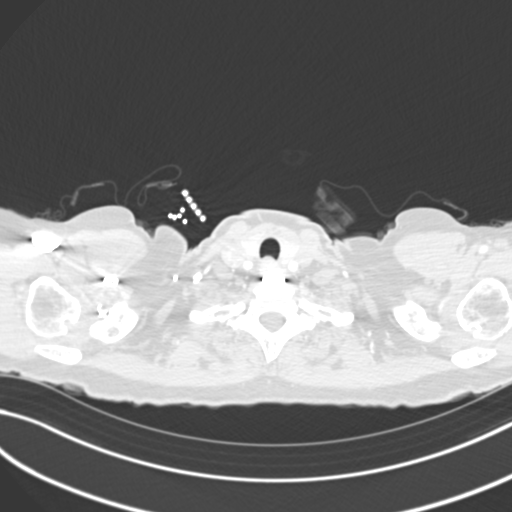

[18 of 36 positions shown; findings below may reference images not displayed]

RADIATION DOSE REDUCTION: This exam was performed according to the
departmental dose-optimization program which includes automated
exposure control, adjustment of the mA and/or kV according to
patient size and/or use of iterative reconstruction technique.

CONTRAST:  100mL OMNIPAQUE IOHEXOL 350 MG/ML SOLN
FINDINGS: Cardiovascular: Negative for pulmonary embolus. Heart is enlarged.
No pericardial effusion.

Mediastinum/Nodes: No pathologically enlarged mediastinal, hilar or
axillary lymph nodes. Esophagus is grossly unremarkable.

Lungs/Pleura: Image quality is degraded by expiratory phase imaging
and respiratory motion. 6 mm posterior segment right upper lobe
nodule (5/32). 4 mm subpleural right middle lobe nodule (5/57),
likely a benign subpleural lymph node. 2 mm lingular nodule (5/44).
No pleural fluid. Airway is otherwise unremarkable.

Upper Abdomen: Visualized portions of the liver, left adrenal gland,
left kidney, spleen, pancreas, stomach and bowel are grossly
unremarkable.

Musculoskeletal: Bilateral breast prostheses, asymmetric in overall
size. Internal curvilinear configuration, suggesting intracapsular
rupture. Degenerative changes in the spine. No worrisome lytic or
sclerotic lesions.

Review of the MIP images confirms the above findings.
IMPRESSION: 1. Negative for pulmonary embolus.
2. Pulmonary nodules measure up to 6 mm in the right upper lobe.
Non-contrast chest CT at 3-6 months is recommended. If the nodules
are stable at time of repeat CT, then future CT at 18-24 months
(from today's scan) is considered optional for low-risk patients,
but is recommended for high-risk patients. This recommendation
follows the consensus statement: Guidelines for Management of
Incidental Pulmonary Nodules Detected on CT Images: From the
3. Bilateral breast prostheses, asymmetric in size. Internal
curvilinear configuration bilaterally suggest intracapsular rupture.
